# Patient Record
Sex: Male | Born: 1990 | Race: White | Hispanic: No | Marital: Single | Smoking: Current some day smoker
Health system: Southern US, Community
[De-identification: ages and names within clinical notes are randomized; demographics above are authoritative.]

## PROBLEM LIST (undated history)

## (undated) DIAGNOSIS — T7840XA Allergy, unspecified, initial encounter: Secondary | ICD-10-CM

## (undated) HISTORY — DX: Allergy, unspecified, initial encounter: T78.40XA

---

## 2013-06-12 HISTORY — PX: EYE SURGERY: SHX253

## 2016-02-25 DIAGNOSIS — B029 Zoster without complications: Secondary | ICD-10-CM | POA: Diagnosis not present

## 2017-02-23 DIAGNOSIS — H1789 Other corneal scars and opacities: Secondary | ICD-10-CM | POA: Diagnosis not present

## 2017-02-23 DIAGNOSIS — H04123 Dry eye syndrome of bilateral lacrimal glands: Secondary | ICD-10-CM | POA: Diagnosis not present

## 2017-03-26 ENCOUNTER — Encounter: Payer: Self-pay | Admitting: Family Medicine

## 2017-03-26 ENCOUNTER — Ambulatory Visit (INDEPENDENT_AMBULATORY_CARE_PROVIDER_SITE_OTHER): Payer: BLUE CROSS/BLUE SHIELD

## 2017-03-26 ENCOUNTER — Encounter (INDEPENDENT_AMBULATORY_CARE_PROVIDER_SITE_OTHER): Payer: Self-pay

## 2017-03-26 ENCOUNTER — Ambulatory Visit (INDEPENDENT_AMBULATORY_CARE_PROVIDER_SITE_OTHER): Payer: BLUE CROSS/BLUE SHIELD | Admitting: Family Medicine

## 2017-03-26 VITALS — BP 113/79 | HR 76 | Temp 97.9°F | Ht 73.0 in | Wt 226.0 lb

## 2017-03-26 DIAGNOSIS — R0789 Other chest pain: Secondary | ICD-10-CM

## 2017-03-26 DIAGNOSIS — Z0000 Encounter for general adult medical examination without abnormal findings: Secondary | ICD-10-CM

## 2017-03-26 LAB — URINALYSIS
BILIRUBIN UA: NEGATIVE
Glucose, UA: NEGATIVE
Ketones, UA: NEGATIVE
Leukocytes, UA: NEGATIVE
NITRITE UA: NEGATIVE
PH UA: 7 (ref 5.0–7.5)
PROTEIN UA: NEGATIVE
SPEC GRAV UA: 1.02 (ref 1.005–1.030)
Urobilinogen, Ur: 0.2 mg/dL (ref 0.2–1.0)

## 2017-03-26 NOTE — Progress Notes (Signed)
Subjective:  Patient ID: Ryan Everett, male    DOB: [birth year]  Age: 26 y.o. MRN: [id number]  CC: New Patient (Initial Visit) (pt here today as a new patient for CPE and also c/o on and off side pain x 1 week.)   HPI Ryan Everett presents for New patient wellness exam.For one week he has also had intermittent twinges of pain at the left costal margin near the anterior axillary line. These can be relieved by changing his torso's position. The last only a few seconds but recur frequently for the last week. They do not cause shortness of breath. He describes the pain as sharp. He cannot tell what might make it worse but the change of position makes it better. It is a 7/10 when it occurs  History Ryan Everett has a past medical history of Allergy.   He has a past surgical history that includes Eye surgery (Bilateral, 2015).   His family history includes Alcohol abuse in his paternal grandfather.He reports that he has been smoking.  He has never used smokeless tobacco. He reports that he drinks about 3.0 oz of alcohol per week . He reports that he does not use drugs. He is the grandson of the [place] that live on [address]. He grew up in [place] but visited here frequently as a child. 4 years ago he moved into a home next to him and it is renting it from his grandparents  Patient takes no medications either prescription or over-the-counter.  ROS Review of Systems  Constitutional: Negative for activity change, appetite change, chills, diaphoresis, fatigue, fever and unexpected weight change.  HENT: Negative for congestion, ear pain, hearing loss, postnasal drip, rhinorrhea, sore throat, tinnitus and trouble swallowing.   Eyes: Negative for photophobia, pain, discharge and redness.  Respiratory: Negative for apnea, cough, choking, chest tightness, shortness of breath, wheezing and stridor.   Cardiovascular: Negative for chest pain, palpitations and leg swelling.  Gastrointestinal: Negative  for abdominal distention, abdominal pain, blood in stool, constipation, diarrhea, nausea and vomiting.  Endocrine: Negative for cold intolerance, heat intolerance, polydipsia, polyphagia and polyuria.  Genitourinary: Negative for difficulty urinating, dysuria, enuresis, flank pain, frequency, genital sores, hematuria and urgency.  Musculoskeletal: Negative for arthralgias and joint swelling.  Skin: Negative for color change, rash and wound.  Allergic/Immunologic: Negative for immunocompromised state.  Neurological: Negative for dizziness, tremors, seizures, syncope, facial asymmetry, speech difficulty, weakness, light-headedness, numbness and headaches.  Hematological: Does not bruise/bleed easily.  Psychiatric/Behavioral: Negative for agitation, behavioral problems, confusion, decreased concentration, dysphoric mood, hallucinations, sleep disturbance and suicidal ideas. The patient is not nervous/anxious and is not hyperactive.     Objective:  BP 113/79   Pulse 76   Temp 97.9 F (36.6 C) (Oral)   Ht 6' 1"  (1.854 m)   Wt 226 lb (102.5 kg)   BMI 29.82 kg/m   Physical Exam  Constitutional: He is oriented to person, place, and time. He appears well-developed and well-nourished.  HENT:  Head: Normocephalic and atraumatic.  Mouth/Throat: Oropharynx is clear and moist.  Eyes: Pupils are equal, round, and reactive to light. EOM are normal.  Neck: Normal range of motion. No tracheal deviation present. No thyromegaly present.  Cardiovascular: Normal rate, regular rhythm and normal heart sounds.  Exam reveals no gallop and no friction rub.   No murmur heard. Pulmonary/Chest: Breath sounds normal. He has no wheezes. He has no rales.  Abdominal: Soft. He exhibits no mass. There is no tenderness. Hernia confirmed negative in the right  inguinal area and confirmed negative in the left inguinal area.  Genitourinary: Penis normal. Right testis shows no mass. Left testis shows no mass. Circumcised.    Musculoskeletal: Normal range of motion. He exhibits no edema.  Neurological: He is alert and oriented to person, place, and time.  Skin: Skin is warm and dry.  Psychiatric: He has a normal mood and affect.    Assessment & Plan:   Ryan Everett was seen today for new patient (initial visit).  Diagnoses and all orders for this visit:  Encounter for wellness examination in adult -     CBC with Differential/Platelet -     CMP14+EGFR -     Lipid panel -     Urinalysis  Left-sided chest wall pain -     DG Chest 2 View; Future   [name] does not currently have medications on file.  No orders of the defined types were placed in this encounter.  Should the chest x-ray show a problem we will contact the patient. Otherwise this appears to be musculoskeletal based on today's evaluation. He should use ibuprofen 400 mg 3 times a day to 800 mg 3 times a day for the next 10-15 days and if the symptoms do not resolve follow-up at the end of that time.  Patient was advised to use caution with use of alcohol. He was also counseled that smoking cigars is not safe and advised to quit. Testicular self-exam also reviewed with the patient.  Follow-up: Return in about 1 year (around 03/26/2018), or if symptoms worsen or fail to improve.  Claretta Fraise, M.D.

## 2017-03-27 LAB — CBC WITH DIFFERENTIAL/PLATELET
BASOS ABS: 0 10*3/uL (ref 0.0–0.2)
Basos: 0 %
EOS (ABSOLUTE): 0.4 10*3/uL (ref 0.0–0.4)
Eos: 6 %
Hematocrit: 46.1 % (ref 37.5–51.0)
Hemoglobin: 15.6 g/dL (ref 13.0–17.7)
IMMATURE GRANULOCYTES: 0 %
Immature Grans (Abs): 0 10*3/uL (ref 0.0–0.1)
LYMPHS ABS: 3.1 10*3/uL (ref 0.7–3.1)
Lymphs: 43 %
MCH: 30.7 pg (ref 26.6–33.0)
MCHC: 33.8 g/dL (ref 31.5–35.7)
MCV: 91 fL (ref 79–97)
MONOS ABS: 0.5 10*3/uL (ref 0.1–0.9)
Monocytes: 7 %
NEUTROS PCT: 44 %
Neutrophils Absolute: 3.1 10*3/uL (ref 1.4–7.0)
PLATELETS: 253 10*3/uL (ref 150–379)
RBC: 5.08 x10E6/uL (ref 4.14–5.80)
RDW: 13 % (ref 12.3–15.4)
WBC: 7.2 10*3/uL (ref 3.4–10.8)

## 2017-03-27 LAB — CMP14+EGFR
ALK PHOS: 72 IU/L (ref 39–117)
ALT: 13 IU/L (ref 0–44)
AST: 16 IU/L (ref 0–40)
Albumin/Globulin Ratio: 1.5 (ref 1.2–2.2)
Albumin: 4.5 g/dL (ref 3.5–5.5)
BUN/Creatinine Ratio: 15 (ref 9–20)
BUN: 11 mg/dL (ref 6–20)
Bilirubin Total: 0.6 mg/dL (ref 0.0–1.2)
CO2: 25 mmol/L (ref 20–29)
CREATININE: 0.71 mg/dL — AB (ref 0.76–1.27)
Calcium: 9.8 mg/dL (ref 8.7–10.2)
Chloride: 98 mmol/L (ref 96–106)
GFR calc Af Amer: 149 mL/min/{1.73_m2} (ref >59)
GFR calc non Af Amer: 129 mL/min/{1.73_m2} (ref >59)
GLUCOSE: 83 mg/dL (ref 65–99)
Globulin, Total: 3.1 g/dL (ref 1.5–4.5)
Potassium: 4.9 mmol/L (ref 3.5–5.2)
SODIUM: 139 mmol/L (ref 134–144)
Total Protein: 7.6 g/dL (ref 6.0–8.5)

## 2017-03-27 LAB — LIPID PANEL
CHOLESTEROL TOTAL: 179 mg/dL (ref 100–199)
Chol/HDL Ratio: 5.4 ratio — ABNORMAL HIGH (ref 0.0–5.0)
HDL: 33 mg/dL — AB (ref >39)
LDL CALC: 105 mg/dL — AB (ref 0–99)
TRIGLYCERIDES: 207 mg/dL — AB (ref 0–149)
VLDL Cholesterol Cal: 41 mg/dL — ABNORMAL HIGH (ref 5–40)

## 2018-03-27 ENCOUNTER — Encounter: Payer: BLUE CROSS/BLUE SHIELD | Admitting: Family Medicine

## 2018-10-10 IMAGING — DX DG CHEST 2V
3 series · 3 of 3 positions shown · non-contrast
Comparison: No recent prior.

CLINICAL DATA: Left-sided chest wall pain.

EXAM:
CHEST  2 VIEW

[chest pa (1 of 2)]
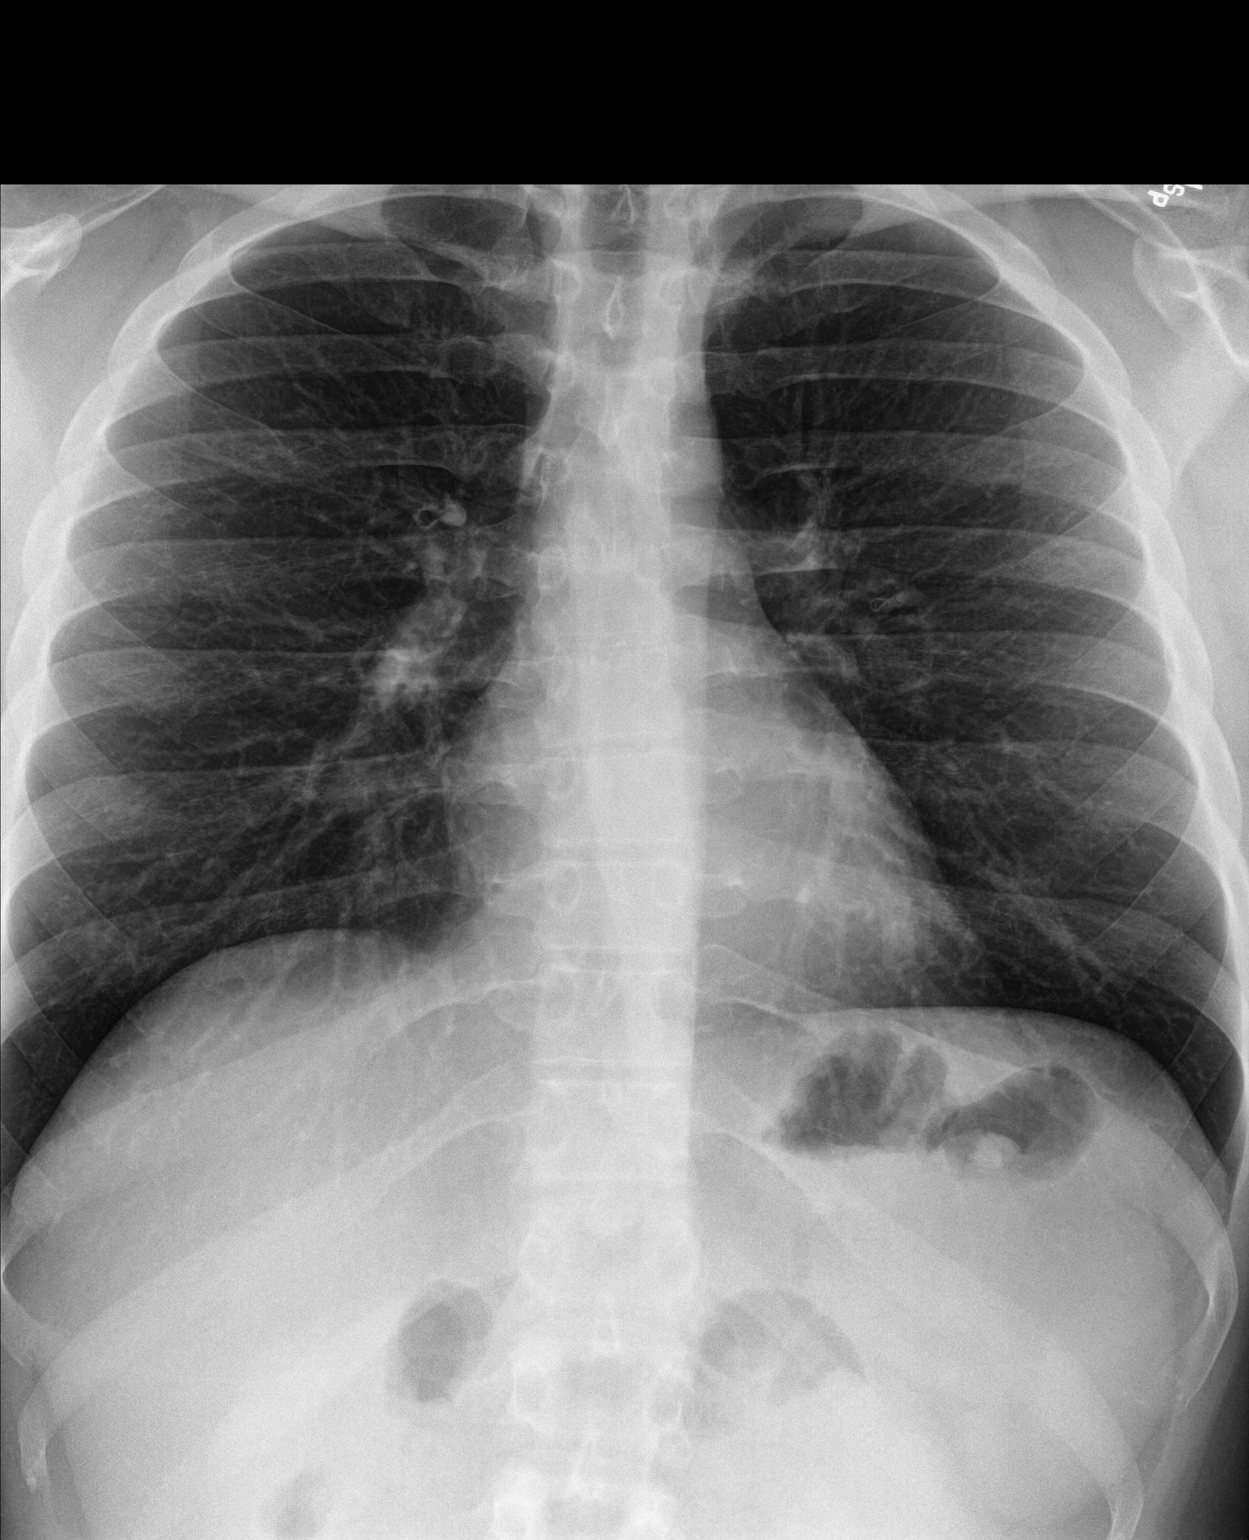

[chest lat]
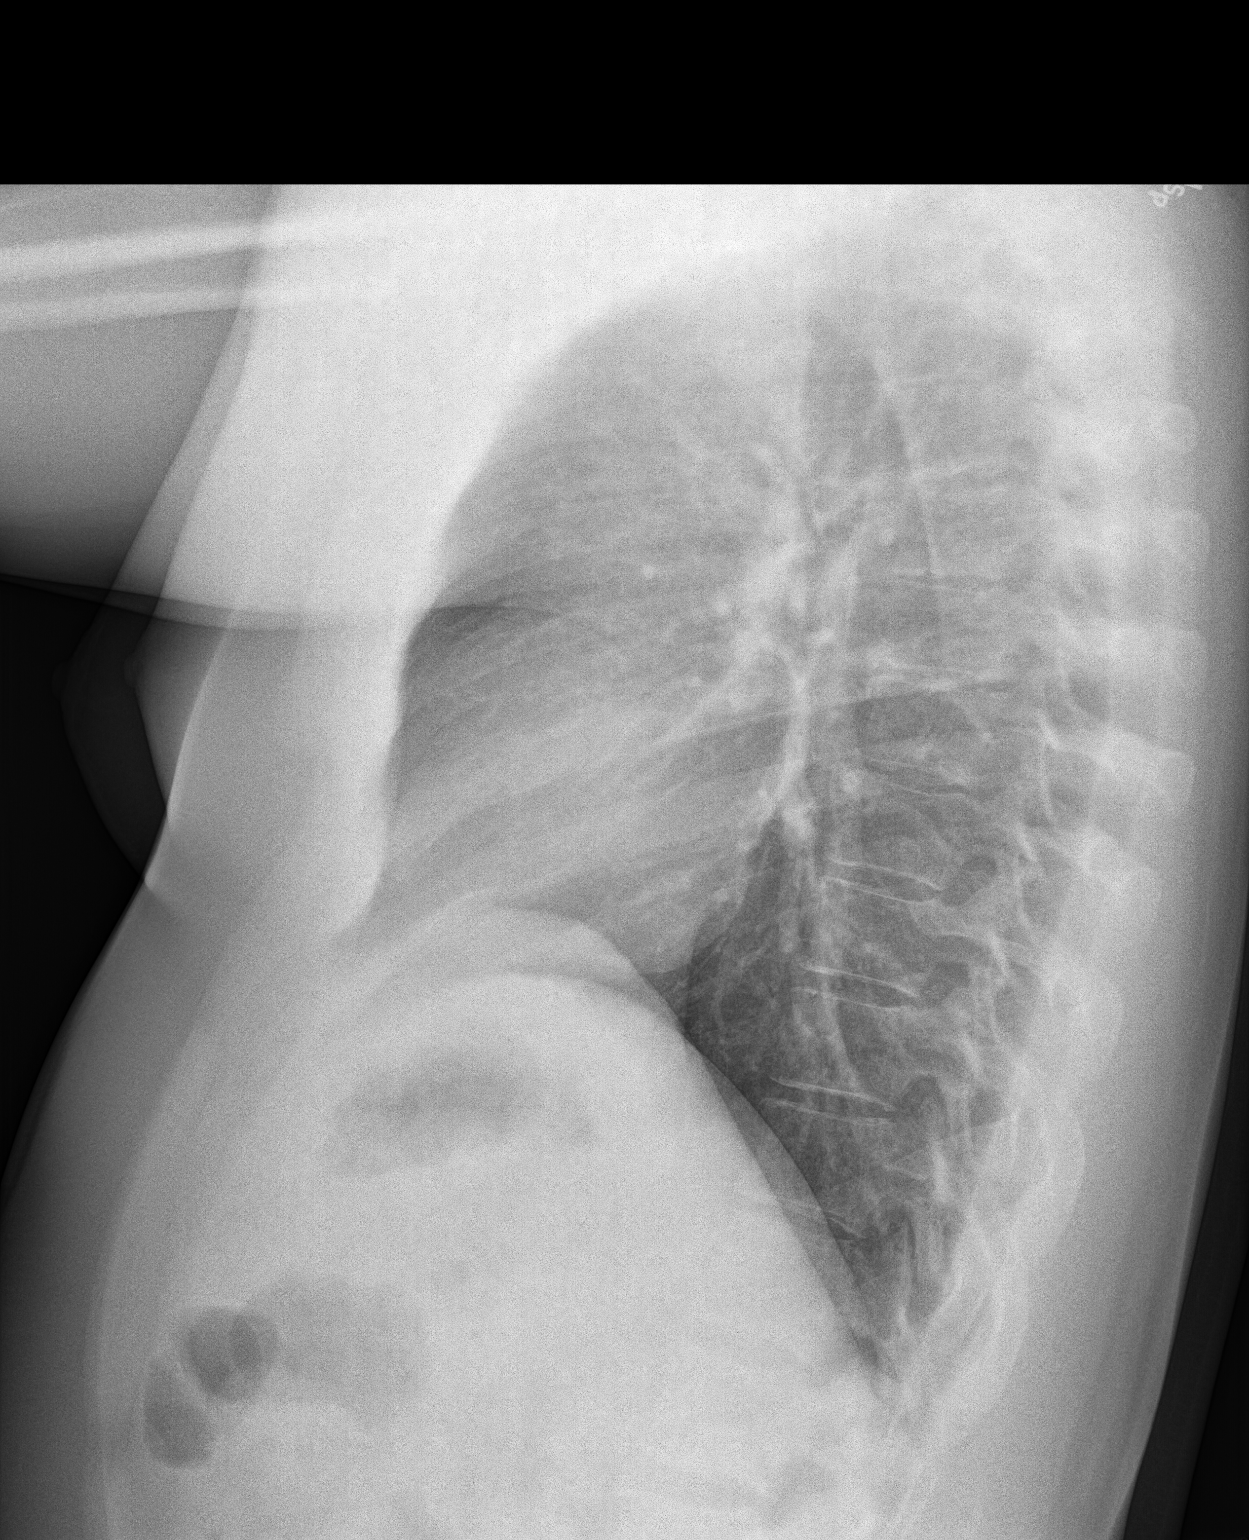

[chest pa (2 of 2)]
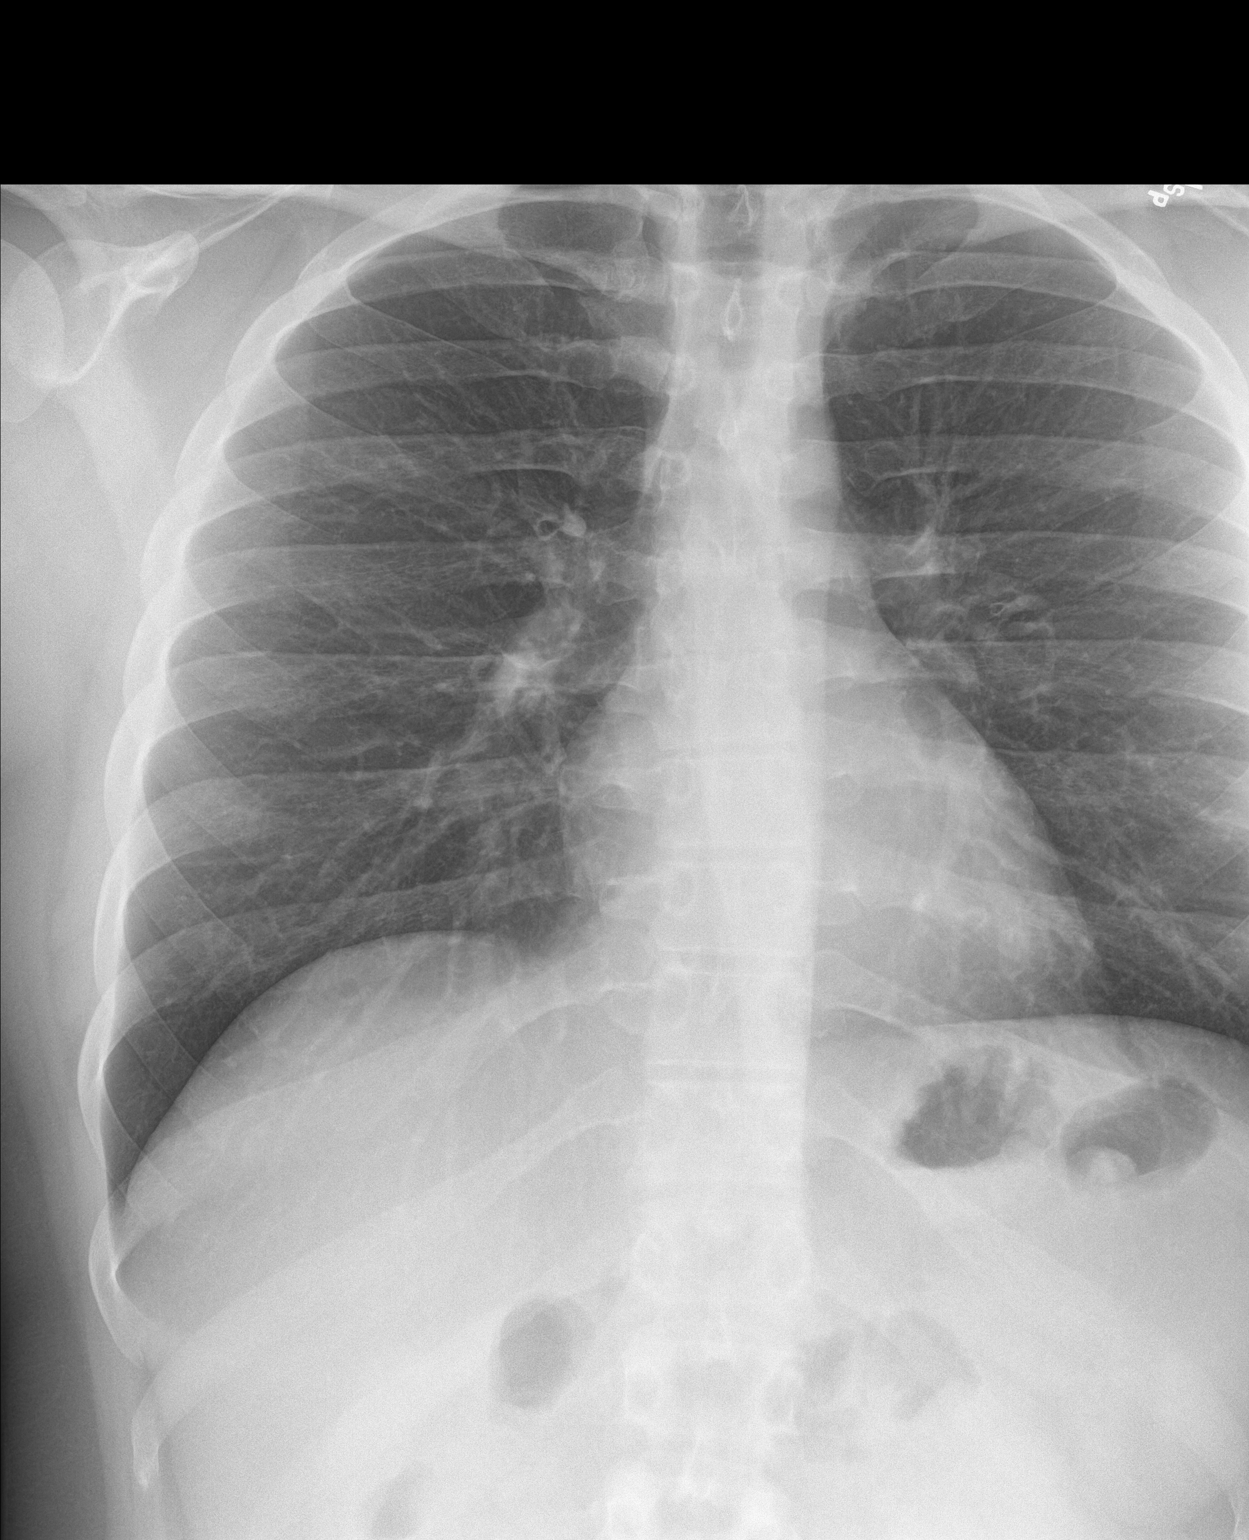

[3 of 3 positions shown; findings below may reference images not displayed]

FINDINGS: Mediastinum hilar structures are normal. Lungs are clear. Heart size
normal. No acute bony abnormality.
IMPRESSION: No acute cardiopulmonary disease.

## 2019-09-19 ENCOUNTER — Other Ambulatory Visit: Payer: Self-pay

## 2019-09-19 ENCOUNTER — Ambulatory Visit: Payer: Self-pay | Attending: Internal Medicine

## 2019-09-19 DIAGNOSIS — Z20822 Contact with and (suspected) exposure to covid-19: Secondary | ICD-10-CM | POA: Insufficient documentation

## 2019-09-20 LAB — SARS-COV-2, NAA 2 DAY TAT

## 2019-09-20 LAB — NOVEL CORONAVIRUS, NAA: SARS-CoV-2, NAA: NOT DETECTED
